# Patient Record
Sex: Female | Born: 1990 | Race: White | Hispanic: No | Smoking: Never smoker
Health system: Southern US, Community
[De-identification: ages and names within clinical notes are randomized; demographics above are authoritative.]

## PROBLEM LIST (undated history)

## (undated) DIAGNOSIS — L501 Idiopathic urticaria: Secondary | ICD-10-CM

## (undated) DIAGNOSIS — T7840XA Allergy, unspecified, initial encounter: Secondary | ICD-10-CM

## (undated) DIAGNOSIS — R87612 Low grade squamous intraepithelial lesion on cytologic smear of cervix (LGSIL): Secondary | ICD-10-CM

## (undated) HISTORY — DX: Low grade squamous intraepithelial lesion on cytologic smear of cervix (LGSIL): R87.612

## (undated) HISTORY — DX: Idiopathic urticaria: L50.1

## (undated) HISTORY — DX: Allergy, unspecified, initial encounter: T78.40XA

---
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
## 2008-03-28 ENCOUNTER — Other Ambulatory Visit: Admission: RE | Admit: 2008-03-28 | Discharge: 2008-03-28 | Payer: Self-pay | Admitting: Internal Medicine

## 2008-09-18 IMAGING — NM NM HEPATO W/GB/PHARM/[PERSON_NAME]
2 series · 12 of 12 positions shown · non-contrast
Comparison: none

CLINICAL DATA: Epigastric pain.  
 NUCLEAR MEDICINE HEPATOBILIARY SCAN WITH EJECTION FRACTION:
TECHNIQUE: Sequential abdominal images were obtained following intravenous injection of radiopharmaceutical.  Sequential images were continued following oral ingestion of 8 oz. half-and-half, and the gallbladder ejection fraction was calculated.
 Radiopharmaceutical:    5.4 mCi Mc-ZZm Choletec.   8 oz. of half-and-half p.o.

[hida · 2.40mm/px · 6 of 60 frames shown (1 of 2)]
[frame 6/60]
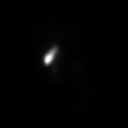
[frame 16/60]
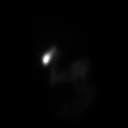
[frame 26/60]
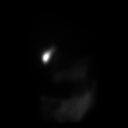
[frame 36/60]
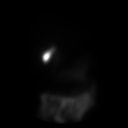
[frame 46/60]
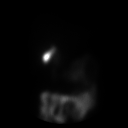
[frame 56/60]
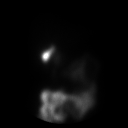

[hida · 2.40mm/px · 6 of 60 frames shown (2 of 2)]
[frame 6/60]
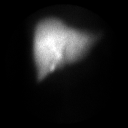
[frame 16/60]
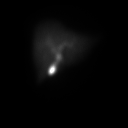
[frame 26/60]
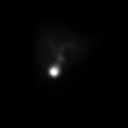
[frame 36/60]
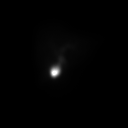
[frame 46/60]
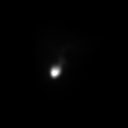
[frame 56/60]
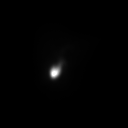

[12 of 12 positions shown; findings below may reference images not displayed]

FINDINGS: There is prompt and uniform uptake in the liver.  The gallbladder is visualized at 10 minutes with bowel activity confidently seen 10 minutes after oral ingestion of half-and-half.  Gallbladder ejection fraction is 82% (normal is greater than 50%).
IMPRESSION: No acute findings.  Normal gallbladder ejection fraction.

## 2009-04-05 ENCOUNTER — Other Ambulatory Visit: Admission: RE | Admit: 2009-04-05 | Discharge: 2009-04-05 | Payer: Self-pay | Admitting: Internal Medicine

## 2009-11-03 ENCOUNTER — Ambulatory Visit: Payer: Self-pay | Admitting: "Endocrinology

## 2010-09-16 ENCOUNTER — Encounter: Payer: Self-pay | Admitting: Internal Medicine

## 2011-01-08 NOTE — Assessment & Plan Note (Signed)
[facility]                         GASTROENTEROLOGY OFFICE NOTE   FONTAINE, KOSSMAN                     MRN:          [id number]  DATE:06/18/2007                            DOB:          [birth year]    The patient is self-referred.   REASON FOR CONSULTATION:  Recurrent abdominal pain.   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  She is  accompanied by her mother.  She has no significant past medical or past  surgical history.  Patient reports an 18 to 24 month history of  recurrent epigastric pain.  The pain is described as sharp.  When  particularly bad, there was radiation into the back.  The pain lasts  anywhere from 30 minutes to 10 hours.  She has had 10 or so episodes  over the past year.  Recently, she had her most severe episode which  lasted about 10 hours.  There is no associated nausea, vomiting, fever,  change in bowel habits, melena, hematochezia, jaundice, urinary  complaints or weight loss.  She has been given Levsin and Bentyl without  improvement.  When she is not experiencing pain she feels perfectly well  with no complaints.  She has not had evaluation.   PAST MEDICAL HISTORY:  None.   PAST SURGICAL HISTORY:  None.   ALLERGIES:  No known drug allergies.   CURRENT MEDICATIONS:  1. Yaz birth control once daily.  2. Multivitamin.  3. Allegra.   FAMILY HISTORY:  1. No family history of gastrointestinal malignancy.  2. Multiple relatives with gallbladder problems requiring      cholecystectomy.   SOCIAL HISTORY:  1. Patient is single without children.  2. She lives with her parents.  3. She is a Quarry manager at [employer].  4. She does not smoke or use alcohol.   REVIEW OF SYSTEMS:  Per Diagnostic Evaluation Form.  Last menstrual  period June 09, 2007.   PHYSICAL EXAM:  A well-appearing female in no acute  distress.  Blood  pressure is 100/62, heart rate 70, weight is 136 pounds, she is 5 feet 5-  1/2 inches in height.  HEENT:  Sclera are anicteric, conjunctiva are pink, oral mucosa is  intact, no adenopathy.  LUNGS:  Clear.  HEART:  Regular.  ABDOMEN:  Soft without tenderness, mass or hernia, good bowel sounds  heard.  EXTREMITIES:  Without edema.   IMPRESSION:  An 20 year old female with recurrent epigastric pain  generally lasting hours.  Well in between attacks of pain.  Symptoms are  most compatible with biliary colic.   RECOMMENDATIONS:  1. Abdominal ultrasound.  2. If no gallstones then proceed to HIDA scan.  3. Further recommendations after the results of the above known.     Wilhemina Bonito. Marina Goodell, MD  Electronically Signed    JNP/MedQ  DD: 06/18/2007  DT: 06/19/2007  Job #: [id number]   cc:   Lovenia Kim, D.O.

## 2011-01-08 NOTE — Assessment & Plan Note (Signed)
[facility]                         GASTROENTEROLOGY OFFICE NOTE   Sophia, Ball                     MRN:          [id number]  DATE:07/24/2007                            DOB:          1991-05-02    HISTORY:  Sophia Ball presents today for followup.  She is accompanied by  her mother.  She was evaluated June 18, 2007, for recurrent abdominal  pain.  See that dictation for details.  Her symptoms were most  compatible with biliary colic.  She underwent an abdominal ultrasound  which was entirely normal.  She subsequently underwent a nuclear  medicine hepatobiliary scan. This was also normal.  Since her last  office visit she has had one additional attack of epigastric pain  lasting approximately 1 hour.  Otherwise, been doing well.   PHYSICAL EXAMINATION TODAY:  Finds a well-appearing female in no acute  distress.  Blood pressure is 98/70, heart rate is 92, weight is 134.8  pounds.  HEENT:  Sclerae anicteric.  ABDOMEN:  Soft without tenderness, mass or hernia.  Good bowel sounds  heard.   IMPRESSION:  Recurrent epigastric pain of uncertain etiology.  Negative  ultrasound and HIDA scan.   RECOMMENDATIONS:  1. Baseline laboratories today including CBC, comprehensive metabolic      panel, pancreatic enzymes, urinalysis, and urine porphyrin screen.  2. Asked to have liver tests, amylase, lipase, and urinalysis      performed during an episode of acute pain.  3. If pain persists without any obvious cause, then consider upper      endoscopy and/or additional imaging.     Wilhemina Bonito. Marina Goodell, MD  Electronically Signed    JNP/MedQ  DD: 07/24/2007  DT: 07/24/2007  Job #: [id number]   cc:   Lovenia Kim, D.O.

## 2011-04-17 ENCOUNTER — Other Ambulatory Visit (HOSPITAL_COMMUNITY)
Admission: RE | Admit: 2011-04-17 | Discharge: 2011-04-17 | Disposition: A | Discharge status: Home / Self Care | Payer: BC Managed Care – PPO | Source: Ambulatory Visit | Attending: Internal Medicine | Admitting: Internal Medicine

## 2011-04-17 DIAGNOSIS — Z01419 Encounter for gynecological examination (general) (routine) without abnormal findings: Secondary | ICD-10-CM | POA: Insufficient documentation

## 2013-06-01 ENCOUNTER — Other Ambulatory Visit (HOSPITAL_COMMUNITY)
Admission: RE | Admit: 2013-06-01 | Discharge: 2013-06-01 | Disposition: A | Discharge status: Home / Self Care | Payer: BC Managed Care – PPO | Source: Ambulatory Visit | Attending: Internal Medicine | Admitting: Internal Medicine

## 2013-06-01 DIAGNOSIS — Z01419 Encounter for gynecological examination (general) (routine) without abnormal findings: Secondary | ICD-10-CM | POA: Insufficient documentation

## 2013-11-11 ENCOUNTER — Ambulatory Visit (INDEPENDENT_AMBULATORY_CARE_PROVIDER_SITE_OTHER): Payer: BC Managed Care – PPO | Admitting: Emergency Medicine

## 2013-11-11 ENCOUNTER — Encounter: Payer: Self-pay | Admitting: Emergency Medicine

## 2013-11-11 VITALS — BP 102/58 | HR 64 | Temp 98.2°F | Resp 16 | Wt 138.0 lb

## 2013-11-11 DIAGNOSIS — J020 Streptococcal pharyngitis: Secondary | ICD-10-CM

## 2013-11-11 MED ORDER — PREDNISONE 10 MG PO TABS
ORAL_TABLET | ORAL | Status: DC
Start: 2013-11-11 — End: 2014-05-10

## 2013-11-11 MED ORDER — DEXAMETHASONE SODIUM PHOSPHATE 100 MG/10ML IJ SOLN
10.0000 mg | Freq: Once | INTRAMUSCULAR | Status: AC
  Administered 2013-11-11: 10 mg via INTRAMUSCULAR

## 2013-11-11 MED ORDER — CEFTRIAXONE SODIUM 1 G IJ SOLR
1.0000 g | Freq: Once | INTRAMUSCULAR | Status: AC
Start: 2013-11-11 — End: 2013-11-11
  Administered 2013-11-11: 1 g via INTRAMUSCULAR

## 2013-11-11 MED ORDER — DOXYCYCLINE HYCLATE 100 MG PO TABS: 100.0000 mg | ORAL_TABLET | Freq: Two times a day (BID) | ORAL | Status: DC

## 2013-11-11 NOTE — Patient Instructions (Signed)
Peritonsillar Abscess Warm salt water gargles daily. 1 tsp liquid benadryl + 1 tsp liquid Maalox, MIX/ GARGLE/ SPIT as needed for pain ER if symptoms increase. Peritonsillar abscess is a collection of yellowish white fluid (pus) in the back of the throat. This fluid forms behind the tonsils. The treatment is most often drainage. This is done by:  Putting a needle into the abscess.  Cutting and draining the abscess. HOME CARE  If your abscess was drained today:  Mix 1 teaspoon of salt in 8 ounces of warm water for gargling.  Gargle the warm salt water.  Gargle 4 times per day or as needed for comfort. Do not swallow this mixture.  Rest in bed as needed.  Return to normal activity as soon as you can.  Apply cold to your neck for pain relief.  Put ice in a plastic bag.  Place a towel between your skin and the bag.  Leave the ice on for 15-20 minutes at a time, 03-04 times a day.  Eat a soft or liquid diet. Drink cold fluids. Cold fluids will soothe and take puffiness (swelling) down.  Only take medicine as told by your doctor.  Take all medicine as told. GET HELP RIGHT AWAY IF:   You are coughing up or throwing up (vomiting) blood.  Your throat pain is severe and pain medicine does not help it.  You have trouble talking or breathing.  You have trouble swallowing or eating.  You find it easier to breathe while leaning forward.  You have pain that gets worse.  You have pain, puffiness, redness, or drainage in your throat.  You have a fever.  You become dizzy, have a headache, have low energy, or feel sick.  You have signs of body fluid loss (dehydration). This includes lightheadedness when standing, peeing (urinating) less, a fast heart rate, or dry mouth and nose.  You start to drool. MAKE SURE YOU:  Understand these instructions.  Will watch your condition.  Will get help if you are not doing well or get worse. Document Released: 07/31/2009 Document  Revised: 11/04/2011 Document Reviewed: 07/31/2009 [facility] Patient Information 2014 [place], [place]. Strep Throat Strep throat is an infection of the throat. It is caused by a germ. Strep throat spreads from person to person by coughing, sneezing, or close contact. HOME CARE  Rinse your mouth (gargle) with warm salt water (1 teaspoon salt in 1 cup of water). Do this 3 to 4 times per day or as needed for comfort.  Family members with a sore throat or fever should see a doctor.  Make sure everyone in your house washes their hands well.  Do not share food, drinking cups, or personal items.  Eat soft foods until your sore throat gets better.  Drink enough water and fluids to keep your pee (urine) clear or pale yellow.  Rest.  Stay home from school, daycare, or work until you have taken medicine for 24 hours.  Only take medicine as told by your doctor.  Take your medicine as told. Finish it even if you start to feel better. GET HELP RIGHT AWAY IF:   You have new problems, such as throwing up (vomiting) or bad headaches.  You have a stiff or painful neck, chest pain, trouble breathing, or trouble swallowing.  You have very bad throat pain, drooling, or changes in your voice.  Your neck puffs up (swells) or gets red and tender.  You have a fever.  You are very tired, your  mouth is dry, or you are peeing less than normal.  You cannot wake up completely.  You get a rash, cough, or earache.  You have green, yellow-brown, or bloody spit.  Your pain does not get better with medicine. MAKE SURE YOU:   Understand these instructions.  Will watch your condition.  Will get help right away if you are not doing well or get worse. Document Released: 01/29/2008 Document Revised: 11/04/2011 Document Reviewed: 10/11/2010 [facility] Patient Information 2014 [place], [place].

## 2013-11-11 NOTE — Progress Notes (Signed)
   Subjective:    Patient ID: Sophia Ball, female    DOB: 1991/08/17, 23 y.o.   MRN: [id number]  HPI Comments: 23 yo female with sore throat for over 1 week. She started Zpak on Tuesday w/o any relief. She notes blisters on large tonsils, increased lymph nodes  And sore throat. She denies fatigue. She has been taking Advil with out relief with her fever and pain.  Sore Throat   Fever  Associated symptoms include a sore throat.     Medication List       This list is accurate as of: 11/11/13  4:46 PM.  Always use your most recent med list.               azithromycin 250 MG tablet  Commonly known as:  ZITHROMAX  Take by mouth daily.     doxycycline 100 MG tablet  Commonly known as:  VIBRA-TABS  Take 1 tablet (100 mg total) by mouth 2 (two) times daily.     drospirenone-ethinyl estradiol 3-0.02 MG tablet  Commonly known as:  YAZ,GIANVI,LORYNA  Take 1 tablet by mouth daily.     predniSONE 10 MG tablet  Commonly known as:  DELTASONE  1 po TID x 3 days, 1 PO BID x 3 days, 1 po QD x 5 days       No Known Allergies No past medical history on file.    Review of Systems  Constitutional: Positive for fever.  HENT: Positive for sore throat.   All other systems reviewed and are negative.   BP 102/58  Pulse 64  Temp(Src) 98.2 F (36.8 C) (Temporal)  Resp 16  Wt 138 lb (62.596 kg)  LMP 10/21/2013     Objective:   Physical Exam  Nursing note and vitals reviewed. Constitutional: She is oriented to person, place, and time. She appears well-developed and well-nourished.  HENT:  Head: Normocephalic and atraumatic.  Right Ear: External ear normal.  Left Ear: External ear normal.  Nose: Nose normal.  Mouth/Throat: Oropharyngeal exudate present.  2+ tonsils erythematous, large patches yellow/ white, with slight odor   Eyes: Conjunctivae and EOM are normal.  Neck: Normal range of motion.  Bilateral quarter size appropriximately   Cardiovascular: Normal rate,  regular rhythm, normal heart sounds and intact distal pulses.   Pulmonary/Chest: Effort normal and breath sounds normal.  Abdominal: Soft. There is no tenderness.  Musculoskeletal: Normal range of motion.  Lymphadenopathy:    She has cervical adenopathy.  Neurological: She is alert and oriented to person, place, and time.  Skin: Skin is warm and dry.  Psychiatric: She has a normal mood and affect. Judgment normal.          Assessment & Plan:  1. Probable strept- Rocephin 1gm, Dexamethasone 10 mg, Continue Zpak AD, Add Doxy 100 BID and Pred DP 10 MG AD tomorrow. Push fluids if any increase in symptoms ER. OOW until Monday.  Advised of risk of abscess patient aware and will go to ER if any sign.

## 2014-05-10 ENCOUNTER — Encounter: Payer: Self-pay | Admitting: Internal Medicine

## 2014-05-10 ENCOUNTER — Ambulatory Visit (INDEPENDENT_AMBULATORY_CARE_PROVIDER_SITE_OTHER): Payer: BC Managed Care – PPO | Admitting: Internal Medicine

## 2014-05-10 VITALS — BP 112/60 | HR 96 | Temp 100.4°F | Resp 16 | Ht 66.0 in | Wt 140.6 lb

## 2014-05-10 DIAGNOSIS — J029 Acute pharyngitis, unspecified: Secondary | ICD-10-CM

## 2014-05-10 DIAGNOSIS — J039 Acute tonsillitis, unspecified: Secondary | ICD-10-CM

## 2014-05-10 MED ORDER — PREDNISONE 20 MG PO TABS: ORAL_TABLET | ORAL | Status: DC

## 2014-05-10 MED ORDER — AZITHROMYCIN 250 MG PO TABS: ORAL_TABLET | ORAL | Status: DC

## 2014-05-10 MED ORDER — HYDROCODONE-ACETAMINOPHEN 5-325 MG PO TABS: ORAL_TABLET | ORAL | Status: AC

## 2014-05-10 NOTE — Progress Notes (Signed)
   Subjective:    Patient ID: Sophia Ball, female    DOB: 06/20/1991, 23 y.o.   MRN: [id number]  Sore Throat  This is a new problem. The current episode started yesterday. The problem has been gradually worsening. Neither side of throat is experiencing more pain than the other. The maximum temperature recorded prior to her arrival was 100 - 100.9 F. The fever has been present for less than 1 day. The pain is mild. Associated symptoms include neck pain and swollen glands. Pertinent negatives include no abdominal pain, congestion, coughing, diarrhea, drooling, ear discharge, ear pain, headaches, hoarse voice, plugged ear sensation, shortness of breath or trouble swallowing. She has tried nothing for the symptoms.   Review of Systems  Constitutional: Positive for fever. Negative for chills and diaphoresis.  HENT: Negative for congestion, dental problem, drooling, ear discharge, ear pain, facial swelling, hearing loss, hoarse voice, mouth sores, nosebleeds, postnasal drip, rhinorrhea, sinus pressure and trouble swallowing.   Eyes: Negative.   Respiratory: Negative.  Negative for cough and shortness of breath.   Gastrointestinal: Negative for abdominal pain and diarrhea.  Musculoskeletal: Positive for neck pain.  Neurological: Negative.  Negative for dizziness, speech difficulty, light-headedness and headaches.   Objective:   Physical Exam  Constitutional: She appears well-nourished.  In No Distress.  HENT:  Right Ear: External ear normal.  Left Ear: External ear normal.  Nose: Nose normal.  Posterior Pharynx 2+ injected with slight exudates and no ulcerations. Tonsils 2+ red and cryptic wit slight exudate.  Eyes: Conjunctivae and EOM are normal. Pupils are equal, round, and reactive to light. Right eye exhibits no discharge. Left eye exhibits no discharge.  Neck: Normal range of motion. Neck supple. No tracheal deviation present.  Cardiovascular: Normal rate, regular rhythm and normal  heart sounds.   No murmur heard. Pulmonary/Chest: Breath sounds normal. No respiratory distress. She has no wheezes. She has no rales. She exhibits no tenderness.  Lymphadenopathy:    She has cervical adenopathy.   Assessment & Plan:   1. Acute tonsillitis  2. Acute pharyngitis, unspecified pharyngitis type  Rx- Z Pak, Prednisone taper, Norco- discussed H2O2/mouthwash gargles & liquid diet  Discussed med effects/SE's. ROV - prn

## 2014-06-06 ENCOUNTER — Encounter: Payer: Self-pay | Admitting: Emergency Medicine

## 2014-06-08 ENCOUNTER — Encounter: Payer: Self-pay | Admitting: Emergency Medicine

## 2014-06-10 ENCOUNTER — Encounter: Payer: Self-pay | Admitting: Emergency Medicine

## 2014-06-23 DIAGNOSIS — T7840XA Allergy, unspecified, initial encounter: Secondary | ICD-10-CM | POA: Insufficient documentation

## 2014-06-30 ENCOUNTER — Encounter: Payer: Self-pay | Admitting: Emergency Medicine

## 2014-07-15 ENCOUNTER — Encounter: Payer: Self-pay | Admitting: Emergency Medicine

## 2014-07-18 ENCOUNTER — Encounter: Payer: Self-pay | Admitting: Emergency Medicine

## 2014-07-28 ENCOUNTER — Encounter: Payer: Self-pay | Admitting: Emergency Medicine

## 2014-08-01 ENCOUNTER — Other Ambulatory Visit: Payer: Self-pay | Admitting: *Deleted

## 2014-08-01 MED ORDER — DROSPIRENONE-ETHINYL ESTRADIOL 3-0.02 MG PO TABS: 1.0000 | ORAL_TABLET | Freq: Every day | ORAL | Status: DC

## 2014-08-03 ENCOUNTER — Other Ambulatory Visit: Payer: Self-pay | Admitting: Physician Assistant

## 2014-08-03 ENCOUNTER — Encounter: Payer: Self-pay | Admitting: Physician Assistant

## 2014-08-03 MED ORDER — DROSPIRENONE-ETHINYL ESTRADIOL 3-0.02 MG PO TABS: 1.0000 | ORAL_TABLET | Freq: Every day | ORAL | Status: DC

## 2014-08-31 ENCOUNTER — Other Ambulatory Visit (HOSPITAL_COMMUNITY)
Admission: RE | Admit: 2014-08-31 | Discharge: 2014-08-31 | Disposition: A | Discharge status: Home / Self Care | Payer: BC Managed Care – PPO | Source: Ambulatory Visit | Attending: Internal Medicine | Admitting: Internal Medicine

## 2014-08-31 ENCOUNTER — Encounter: Payer: Self-pay | Admitting: Emergency Medicine

## 2014-08-31 ENCOUNTER — Ambulatory Visit (INDEPENDENT_AMBULATORY_CARE_PROVIDER_SITE_OTHER): Payer: BLUE CROSS/BLUE SHIELD | Admitting: Emergency Medicine

## 2014-08-31 VITALS — BP 98/62 | HR 58 | Temp 98.4°F | Resp 16 | Ht 66.0 in | Wt 140.0 lb

## 2014-08-31 DIAGNOSIS — Z79899 Other long term (current) drug therapy: Secondary | ICD-10-CM

## 2014-08-31 DIAGNOSIS — E559 Vitamin D deficiency, unspecified: Secondary | ICD-10-CM

## 2014-08-31 DIAGNOSIS — Z124 Encounter for screening for malignant neoplasm of cervix: Secondary | ICD-10-CM

## 2014-08-31 DIAGNOSIS — Z01411 Encounter for gynecological examination (general) (routine) with abnormal findings: Secondary | ICD-10-CM | POA: Insufficient documentation

## 2014-08-31 DIAGNOSIS — Z0000 Encounter for general adult medical examination without abnormal findings: Secondary | ICD-10-CM

## 2014-08-31 MED ORDER — DROSPIRENONE-ETHINYL ESTRADIOL 3-0.02 MG PO TABS: 1.0000 | ORAL_TABLET | Freq: Every day | ORAL | Status: AC

## 2014-09-01 LAB — CBC WITH DIFFERENTIAL/PLATELET
Basophils Absolute: 0 10*3/uL (ref 0.0–0.1)
Basophils Relative: 0 % (ref 0–1)
Eosinophils Absolute: 0.2 10*3/uL (ref 0.0–0.7)
Eosinophils Relative: 2 % (ref 0–5)
HEMATOCRIT: 42.1 % (ref 36.0–46.0)
Hemoglobin: 13.9 g/dL (ref 12.0–15.0)
LYMPHS PCT: 20 % (ref 12–46)
Lymphs Abs: 1.8 10*3/uL (ref 0.7–4.0)
MCH: 28 pg (ref 26.0–34.0)
MCHC: 33 g/dL (ref 30.0–36.0)
MCV: 84.9 fL (ref 78.0–100.0)
MPV: 9.3 fL (ref 8.6–12.4)
Monocytes Absolute: 0.5 10*3/uL (ref 0.1–1.0)
Monocytes Relative: 6 % (ref 3–12)
NEUTROS PCT: 72 % (ref 43–77)
Neutro Abs: 6.6 10*3/uL (ref 1.7–7.7)
PLATELETS: 330 10*3/uL (ref 150–400)
RBC: 4.96 MIL/uL (ref 3.87–5.11)
RDW: 14.2 % (ref 11.5–15.5)
WBC: 9.1 10*3/uL (ref 4.0–10.5)

## 2014-09-01 LAB — TSH: TSH: 0.708 u[IU]/mL (ref 0.350–4.500)

## 2014-09-01 LAB — LIPID PANEL
Cholesterol: 200 mg/dL (ref 0–200)
HDL: 110 mg/dL (ref >39)
LDL Cholesterol: 76 mg/dL (ref 0–99)
Total CHOL/HDL Ratio: 1.8 Ratio
Triglycerides: 68 mg/dL (ref <150)
VLDL: 14 mg/dL (ref 0–40)

## 2014-09-01 LAB — HEMOGLOBIN A1C
Hgb A1c MFr Bld: 5.2 % (ref <5.7)
MEAN PLASMA GLUCOSE: 103 mg/dL (ref <117)

## 2014-09-01 LAB — BASIC METABOLIC PANEL WITH GFR
BUN: 8 mg/dL (ref 6–23)
CALCIUM: 10 mg/dL (ref 8.4–10.5)
CHLORIDE: 102 meq/L (ref 96–112)
CO2: 26 meq/L (ref 19–32)
CREATININE: 0.7 mg/dL (ref 0.50–1.10)
GFR, Est African American: >89 mL/min
GFR, Est Non African American: >89 mL/min
GLUCOSE: 87 mg/dL (ref 70–99)
Potassium: 4.1 mEq/L (ref 3.5–5.3)
Sodium: 138 mEq/L (ref 135–145)

## 2014-09-01 LAB — HEPATIC FUNCTION PANEL
ALK PHOS: 49 U/L (ref 39–117)
ALT: 9 U/L (ref 0–35)
AST: 16 U/L (ref 0–37)
Albumin: 4.6 g/dL (ref 3.5–5.2)
BILIRUBIN DIRECT: 0.1 mg/dL (ref 0.0–0.3)
BILIRUBIN TOTAL: 0.6 mg/dL (ref 0.2–1.2)
Indirect Bilirubin: 0.5 mg/dL (ref 0.2–1.2)
Total Protein: 7.9 g/dL (ref 6.0–8.3)

## 2014-09-01 LAB — URINALYSIS, ROUTINE W REFLEX MICROSCOPIC
Bilirubin Urine: NEGATIVE
Glucose, UA: NEGATIVE mg/dL
Hgb urine dipstick: NEGATIVE
Ketones, ur: NEGATIVE mg/dL
Leukocytes, UA: NEGATIVE
NITRITE: NEGATIVE
PH: 6.5 (ref 5.0–8.0)
Protein, ur: NEGATIVE mg/dL
SPECIFIC GRAVITY, URINE: 1.011 (ref 1.005–1.030)
UROBILINOGEN UA: 0.2 mg/dL (ref 0.0–1.0)

## 2014-09-01 LAB — INSULIN, FASTING: Insulin fasting, serum: 4 u[IU]/mL (ref 2.0–19.6)

## 2014-09-01 LAB — VITAMIN D 25 HYDROXY (VIT D DEFICIENCY, FRACTURES): VIT D 25 HYDROXY: 25 ng/mL — AB (ref 30–100)

## 2014-09-01 LAB — MAGNESIUM: MAGNESIUM: 2 mg/dL (ref 1.5–2.5)

## 2014-09-02 LAB — CYTOLOGY - PAP

## 2014-09-03 ENCOUNTER — Encounter: Payer: Self-pay | Admitting: Emergency Medicine

## 2014-09-03 NOTE — Progress Notes (Addendum)
Subjective:    Patient ID: Sophia Ball, female    DOB: 01-12-1991, 24 y.o.   MRN: [id number]  HPI Comments: 24 yo WF CPE without concerns or complaints. She is doing well overall. She is exercising routinely and eating healthy.      Medication List       This list is accurate as of: 08/31/14 11:59 PM.  Always use your most recent med list.               drospirenone-ethinyl estradiol 3-0.02 MG tablet  Commonly known as:  YAZ,GIANVI,LORYNA  Take 1 tablet by mouth daily.     levocetirizine 5 MG tablet  Commonly known as:  XYZAL  Take 5 mg by mouth every evening.       No Known Allergies   Past Medical History  Diagnosis Date  . Allergy    History reviewed. No pertinent past surgical history.  History  Substance Use Topics  . Smoking status: Never Smoker   . Smokeless tobacco: Not on file  . Alcohol Use: Not on file   Family History  Problem Relation Age of Onset  . Cancer Maternal Aunt 60    great aunt-breast  . Heart disease Maternal Grandmother   . Hyperlipidemia Maternal Grandmother   . Heart disease Maternal Grandfather   . Hyperlipidemia Maternal Grandfather   . Heart disease Paternal Grandmother   . Hyperlipidemia Paternal Grandmother   . Heart disease Paternal Grandfather   . Hyperlipidemia Paternal Grandfather      MAINTENANCE: Pap/ Pelvic:2014 wnl LMP: 2 weeks ago EYE:n/a Dentist:Q 6 month  IMMUNIZATIONS: Tdap:2014 Influenza: 2015 at work  Patient Care Team: Lucky CowboyWilliam McKeown, MD as PCP - General (Internal Medicine) Eileen StanfordMeg Whelan, MD as Referring Physician (Allergy and Immunology) Hilarie FredricksonJohn N Perry, MD as Consulting Physician (Gastroenterology) Amber Register, PA-C as Physician Assistant (Dermatology)   Review of Systems  Respiratory: Negative for shortness of breath.   Cardiovascular: Negative for chest pain.  All other systems reviewed and are negative.  BP 98/62 mmHg  Pulse 58  Temp(Src) 98.4 F (36.9 C) (Temporal)  Resp 16  Ht 5'  6" (1.676 m)  Wt 140 lb (63.504 kg)  BMI 22.61 kg/m2  LMP 08/17/2014     Objective:   Physical Exam  Constitutional: She is oriented to person, place, and time. She appears well-developed and well-nourished. No distress.  HENT:  Head: Normocephalic and atraumatic.  Right Ear: External ear normal.  Left Ear: External ear normal.  Nose: Nose normal.  Mouth/Throat: Oropharynx is clear and moist.  Eyes: Conjunctivae and EOM are normal. Pupils are equal, round, and reactive to light. Right eye exhibits no discharge. Left eye exhibits no discharge. No scleral icterus.  Neck: Normal range of motion. Neck supple. No JVD present. No tracheal deviation present. No thyromegaly present.  Cardiovascular: Normal rate, regular rhythm, normal heart sounds and intact distal pulses.   Pulmonary/Chest: Effort normal and breath sounds normal.  Abdominal: Soft. Bowel sounds are normal. She exhibits no distension and no mass. There is no tenderness. There is no rebound and no guarding.  Genitourinary: Vagina normal and uterus normal.  BreastsWNL  Musculoskeletal: Normal range of motion. She exhibits no edema or tenderness.  Lymphadenopathy:    She has no cervical adenopathy.  Neurological: She is alert and oriented to person, place, and time. She has normal reflexes. No cranial nerve deficit. She exhibits normal muscle tone. Coordination normal.  Skin: Skin is warm and dry. No rash noted. No  erythema. No pallor.  Psychiatric: She has a normal mood and affect. Her behavior is normal. Judgment and thought content normal.  Nursing note and vitals reviewed.  EKG NSCSPT  Repeat at age 54 unless symptomatic    Assessment & Plan:  1. CPE- Update screening labs/ History/ Immunizations/ Testing as needed. Advised healthy diet, QD exercise, increase H20 and continue RX/ Vitamins AD.  2. Birth Control- Refill x 1 year   Discussed with patient difficulty with scheduling for CPE with provider scheduling changed.  Advised apology for Email she received from our office in regards to missed appointment, especially since no previous history of NON-compliance.

## 2014-09-05 ENCOUNTER — Other Ambulatory Visit: Payer: Self-pay | Admitting: Internal Medicine

## 2014-09-05 DIAGNOSIS — B373 Candidiasis of vulva and vagina: Secondary | ICD-10-CM

## 2014-09-05 DIAGNOSIS — B3731 Acute candidiasis of vulva and vagina: Secondary | ICD-10-CM

## 2014-09-05 MED ORDER — FLUCONAZOLE 150 MG PO TABS: ORAL_TABLET | ORAL | Status: AC

## 2014-09-05 MED ORDER — FLUCONAZOLE 150 MG PO TABS: ORAL_TABLET | ORAL | Status: DC

## 2015-07-26 ENCOUNTER — Encounter: Payer: Self-pay | Admitting: Physician Assistant

## 2015-07-31 ENCOUNTER — Encounter: Payer: Self-pay | Admitting: Physician Assistant

## 2015-08-27 DIAGNOSIS — R87612 Low grade squamous intraepithelial lesion on cytologic smear of cervix (LGSIL): Secondary | ICD-10-CM

## 2015-08-27 HISTORY — DX: Low grade squamous intraepithelial lesion on cytologic smear of cervix (LGSIL): R87.612

## 2015-09-05 ENCOUNTER — Encounter: Payer: Self-pay | Admitting: Internal Medicine

## 2016-11-11 DIAGNOSIS — Z6822 Body mass index (BMI) 22.0-22.9, adult: Secondary | ICD-10-CM | POA: Diagnosis not present

## 2016-11-11 DIAGNOSIS — Z01419 Encounter for gynecological examination (general) (routine) without abnormal findings: Secondary | ICD-10-CM | POA: Diagnosis not present

## 2017-04-21 DIAGNOSIS — M79672 Pain in left foot: Secondary | ICD-10-CM | POA: Diagnosis not present

## 2017-04-21 DIAGNOSIS — M722 Plantar fascial fibromatosis: Secondary | ICD-10-CM | POA: Diagnosis not present

## 2017-09-25 DIAGNOSIS — J101 Influenza due to other identified influenza virus with other respiratory manifestations: Secondary | ICD-10-CM | POA: Diagnosis not present

## 2017-11-03 DIAGNOSIS — Z6822 Body mass index (BMI) 22.0-22.9, adult: Secondary | ICD-10-CM | POA: Diagnosis not present

## 2017-11-03 DIAGNOSIS — Z01419 Encounter for gynecological examination (general) (routine) without abnormal findings: Secondary | ICD-10-CM | POA: Diagnosis not present

## 2017-11-03 DIAGNOSIS — R87612 Low grade squamous intraepithelial lesion on cytologic smear of cervix (LGSIL): Secondary | ICD-10-CM | POA: Diagnosis not present

## 2017-11-03 LAB — HM PAP SMEAR: HM Pap smear: NORMAL

## 2018-01-27 ENCOUNTER — Ambulatory Visit: Payer: BLUE CROSS/BLUE SHIELD | Admitting: Family Medicine

## 2018-01-27 ENCOUNTER — Encounter: Payer: Self-pay | Admitting: Family Medicine

## 2018-01-27 VITALS — BP 112/70 | HR 58 | Temp 98.6°F | Resp 16 | Ht 66.5 in | Wt 146.0 lb

## 2018-01-27 DIAGNOSIS — J029 Acute pharyngitis, unspecified: Secondary | ICD-10-CM | POA: Diagnosis not present

## 2018-01-27 LAB — POCT RAPID STREP A (OFFICE): Rapid Strep A Screen: NEGATIVE

## 2018-01-27 NOTE — Progress Notes (Signed)
Chief Complaint  Patient presents with  . 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  Has been going to Sophia Ball, OB/GYN but has not had a PCP.    Sophia Ball is a 27 y.o. female who presents for evaluation of sore throat.  She has had a recent close exposure to someone with proven streptococcal pharyngitis at work last week.  Associated symptoms include body aches.  Denies fever, chills, headache, rhinorrhea, nasal congestion, ear pain, cough, chest pain, abdominal pain, N/V/D.   Reports having strep several times in the past. Last time was in February. No recent antibiotic.    Treatment to date: ibuprofen yesterday, nothing today.  No other aggravating or relieving factors.  No other c/o.  The following portions of the patient's history were reviewed and updated as appropriate: allergies, current medications, past medical history, past social history, past surgical history and problem list.  Grad[employer]. Currently working for [employer].   ROS as in subjective   Objective: Vitals:   01/27/18 1621  BP: 112/70  Pulse: (!) 58  Resp: 16  Temp: 98.6 F (37 C)  SpO2: 98%    General appearance: no distress, WD/WN, mildly ill-appearing HEENT: normocephalic, conjunctiva/corneas normal, sclerae anicteric, nares patent, no discharge or erythema, pharynx with erythema, without exudate.  Oral cavity: MMM, no lesions  Neck: supple, no lymphadenopathy, no thyromegaly Heart: RRR, normal S1, S2, no murmurs Lungs: CTA bilaterally, no wheezes, rhonchi, or rales   Laboratory Strep test done. Results:negative.    Assessment and Plan:  Advised that symptoms and exam suggest a viral etiology.  Discussed symptomatic treatment including salt water gargles, warm fluids, rest, hydrate well, can use  over-the-counter Tylenol or Ibuprofen for throat pain, fever, or malaise. If worse or not improving within 2-3 days, call or return.

## 2018-04-06 ENCOUNTER — Encounter: Payer: Self-pay | Admitting: Family Medicine

## 2018-04-06 ENCOUNTER — Ambulatory Visit: Payer: BLUE CROSS/BLUE SHIELD | Admitting: Family Medicine

## 2018-04-06 VITALS — BP 108/64 | HR 68 | Temp 99.2°F | Ht 65.0 in | Wt 143.4 lb

## 2018-04-06 DIAGNOSIS — R300 Dysuria: Secondary | ICD-10-CM | POA: Diagnosis not present

## 2018-04-06 DIAGNOSIS — N3000 Acute cystitis without hematuria: Secondary | ICD-10-CM

## 2018-04-06 LAB — POCT URINALYSIS DIP (PROADVANTAGE DEVICE)
GLUCOSE UA: NEGATIVE mg/dL
Ketones, POC UA: NEGATIVE mg/dL
NITRITE UA: POSITIVE — AB
PH UA: 6.5 (ref 5.0–8.0)
Protein Ur, POC: NEGATIVE mg/dL
Specific Gravity, Urine: 1.01

## 2018-04-06 MED ORDER — SULFAMETHOXAZOLE-TRIMETHOPRIM 800-160 MG PO TABS: 1.0000 | ORAL_TABLET | Freq: Two times a day (BID) | ORAL | 0 refills | Status: DC

## 2018-04-06 NOTE — Progress Notes (Signed)
Chief Complaint  Patient presents with  . Urinary Frequency    had UTI two weeks, was treated through E-visit and given macrobid BID x 7days. Symptoms started again last night of burning and frequency. Took last macrobid 8/5 in the evening.    While in [place] she developed UTI. Symptoms were mild at first, quickly got worse, saw blood in the urine. She did e-visit, prescribed macrobid x 7 days. Symptoms improved significantly, but last week wasn't sure if she had some slight residual symptoms, slightly sensitive.  Finished macrobid last week Last night she developed dysuria, blood in the urine, urgency/frequency. Took Azo last night at midnight (couldn't sleep), took another dose this morning. Recalls that prior to last UTI she did not void after intercourse.   PMH, PSH, SH reviewed (new patient to me) In a sexual relationship, boyfriend lives out of town.  Current Outpatient Medications on File Prior to Visit  Medication Sig Dispense Refill  . drospirenone-ethinyl estradiol (YAZ,GIANVI,LORYNA) 3-0.02 MG tablet Take 1 tablet by mouth daily. 3 Package 4   No current facility-administered medications on file prior to visit.     She denies fever, chills, nausea, vomiting, bowel changes, flank pain, URI symptoms, bleeding (except noted in urine), bruising, rash or other complaints.    PHYSICAL EXAM:  BP 108/64   Pulse 68   Temp 99.2 F (37.3 C) (Tympanic)   Ht 5\' 5"  (1.651 m)   Wt 143 lb 6.4 oz (65 kg)   LMP 04/01/2018 (Approximate)   BMI 23.86 kg/m   Well appearing, pleasant female, in no distress HEENT: conjunctiva and sclera are clear EOMI Neck: no lymphadenopathy or mass Heart: regular rate and rhythm Lungs: clear bilaterally Back: no CVA tenderness Abdomen: very mild suprapubic tenderness. No mass, no rebound, guarding, organomegaly or mass Extremities: no edema Skin: normal turgor, no rash Psych: normal mood, affect, hygiene and grooming  Urine dip: On AZO, orange in  color, not accurate reading   ASSESSMENT/PLAN:  Acute recurrent cystitis - send culture; treat with Bactrim (recently had macrobid). Risks/SE reviewed, as well as hygiene, voiding after intercourse - Plan: Urine Culture, sulfamethoxazole-trimethoprim (BACTRIM DS,SEPTRA DS) 800-160 MG tablet  Burning with urination - Plan: POCT Urinalysis DIP (Proadvantage Device), Urine Culture    Sign ROR Dr. Arlester Markerousins--she is scheduled for CPE in Jan; reports she had bloodwork through Dr. Cherly Hensenousins.

## 2018-04-06 NOTE — Patient Instructions (Signed)

## 2018-04-07 LAB — URINE CULTURE

## 2018-04-08 ENCOUNTER — Encounter: Payer: Self-pay | Admitting: Family Medicine

## 2018-04-20 ENCOUNTER — Encounter: Payer: Self-pay | Admitting: *Deleted

## 2018-05-24 DIAGNOSIS — L509 Urticaria, unspecified: Secondary | ICD-10-CM | POA: Diagnosis not present

## 2018-05-25 ENCOUNTER — Encounter: Payer: Self-pay | Admitting: Family Medicine

## 2018-05-26 ENCOUNTER — Telehealth: Payer: Self-pay | Admitting: Family Medicine

## 2018-05-26 ENCOUNTER — Ambulatory Visit: Payer: BLUE CROSS/BLUE SHIELD | Admitting: Family Medicine

## 2018-05-26 ENCOUNTER — Encounter: Payer: Self-pay | Admitting: Family Medicine

## 2018-05-26 VITALS — BP 100/68 | HR 72 | Temp 99.1°F | Ht 65.0 in | Wt 144.8 lb

## 2018-05-26 DIAGNOSIS — L509 Urticaria, unspecified: Secondary | ICD-10-CM | POA: Diagnosis not present

## 2018-05-26 DIAGNOSIS — R7989 Other specified abnormal findings of blood chemistry: Secondary | ICD-10-CM | POA: Diagnosis not present

## 2018-05-26 MED ORDER — METHYLPREDNISOLONE ACETATE 80 MG/ML IJ SUSP
80.0000 mg | Freq: Once | INTRAMUSCULAR | Status: AC
  Administered 2018-05-26: 80 mg via INTRAMUSCULAR

## 2018-05-26 NOTE — Patient Instructions (Addendum)
  Increase ranitidine (zantac) to 150mg  TWICE daily. Continue the zyrtec once daily. Use benadryl as needed for hives. Take 50mg  at bedtime, since your morning is the worst. We are giving you another steroid shot today. Tomorrow take 40mg  for 3 days, 30 for 3 days, 20 for 3 days, 10 for 5 days, knowing that we will need to change and adjust this plan based on if/when your hives recur. Contact us later in the week--we should have results by the end of the week to help also determine the plan (which may include referral to allergist, vs rheum if autoimmune test are abnormal.

## 2018-05-26 NOTE — Telephone Encounter (Signed)
Received requested records from [facility]. Sending back for review.  °

## 2018-05-26 NOTE — Progress Notes (Signed)
Chief Complaint  Patient presents with  . Rash    started Sept 11th and has gotten progressively worse. This Sunday was the worst. Went to UC. Had steroid shot, oral prednisone, zantac and zyrtec.    9/11 woke up with hives (back of her legs, wrists).  Took benadryl, didn't have them the next day, thought related to hotel she stayed at (was traveling for work at the time). Recurred the following day.  Moved up into her face just this past Sunday. Hives typically fade within a few hours of waking up, doesn't bother her during the day, wakes up with them Took 1 benadryl at 4 am (67m), didn't help.  2011 had similar problem, lasted for 6 months. CH50 elevated, other tests negative (thyroid, other complement levels--showed me lab slips that her mom emailed her on her phone). Negative allergy tests (other than dog/cat/dust). Ultimately it resolved, and hadn't had a problem again until 9/11.  No known triggers.  She had stayed in same hotels previously.  [place] +stress (nothing particularly new, just related to job/travel), no different than in the last few years.  Previously had more lip/tongue swelling, developed after finishing a prednisone taper.  Today she had slight lip swelling. No tongue/throat/SOB.  Neck is somewhat sensitive and painful today, feels like she may have slept wrong. Hadn't had neck pain/tightness prior to this morning.  No known fevers, URI symptoms, headaches, dizziness, other rashes.  No ABX since July (when in [place]. No dental pain, sore throat, GI or GU complaints. No joint pains.  PHYSICAL EXAM: BP 100/68   Pulse 72   Temp 99.1 F (37.3 C) (Tympanic)   Ht _0  (1.651 m)   Wt 144 lb 12.8 oz (65.7 kg)   LMP 05/01/2018 (Exact Date)   BMI 24.10 kg/m   Pleasant female, in no acute distress. Skin: Urticarial lesions on both cheeks, diffuse on arms.  Largest and most severe on both buttocks, posterior  thighs, mild across abdomen. HEENT: conjunctiva and sclera are clear, PERRL. TM's and EAC's normal. OP is clear, no lesions, swelling, moist mucus membranes.  Neck: no spinal tenderness, muscle spasm.  FROM.  Mild discomfort over right trapezius, no spasm, somewhat tender bilaterally, mild, at paraspinous muscles.  No lymphadenopathy (slight shotty node, nontender on right, pt states is not new), thyromegaly or mass Heart: regular rate and rhythm Lungs: clear bilaterally, no wheezes, rales, ronchi  ASSESSMENT/PLAN:  Urticaria of unknown origin - recurrent since 9/11, worsening; h/o chronic urticaria in 2011 x6 mos. LG temp--?viral, but persistent--check labs. May need allergist (vs rheum if abn lab) - Plan: Comprehensive metabolic panel, CBC with Differential/Platelet, Sedimentation Rate, ANA, IFA (with reflex), Thyroid Panel With TSH, methylPREDNISolone acetate (DEPO-MEDROL) injection 80 mg   Increase ranitidine (zantac) to 1565mTWICE daily. Continue the zyrtec once daily. Use benadryl as needed for hives, and take qHS (since worse in mornings)  Depo medrol 8086miven. Start back with oral prednisone tomorrow (took 78m74mready today), 40/40/40/30/30/30/20/20/20/ and 10 x 5 days, knowing that she will need refill and may need dose adjustments depending on if/when hives resolve or recur. Has enough to last a few days, and contact [place] the [place] with her response.   CBC, TSH, ANA, c-met, esr Consider allergy testing (blood, since on prednisone), hold off for now; prior allergy tests (skin), didn't reveal anything, and no history to suggest a potential cause to search for.   C4, complement  and allergy testing in future, depending on results

## 2018-05-27 ENCOUNTER — Encounter: Payer: Self-pay | Admitting: Family Medicine

## 2018-05-27 DIAGNOSIS — L509 Urticaria, unspecified: Secondary | ICD-10-CM

## 2018-05-27 LAB — CBC WITH DIFFERENTIAL/PLATELET
Basophils Absolute: 0 10*3/uL (ref 0.0–0.2)
Basos: 0 %
EOS (ABSOLUTE): 0 10*3/uL (ref 0.0–0.4)
Eos: 0 %
Hematocrit: 39 % (ref 34.0–46.6)
Hemoglobin: 13.1 g/dL (ref 11.1–15.9)
Immature Grans (Abs): 0 10*3/uL (ref 0.0–0.1)
Immature Granulocytes: 0 %
LYMPHS: 13 %
Lymphocytes Absolute: 1.7 10*3/uL (ref 0.7–3.1)
MCH: 29.4 pg (ref 26.6–33.0)
MCHC: 33.6 g/dL (ref 31.5–35.7)
MCV: 88 fL (ref 79–97)
MONOCYTES: 7 %
Monocytes Absolute: 1 10*3/uL — ABNORMAL HIGH (ref 0.1–0.9)
Neutrophils Absolute: 10.4 10*3/uL — ABNORMAL HIGH (ref 1.4–7.0)
Neutrophils: 80 %
Platelets: 323 10*3/uL (ref 150–450)
RBC: 4.45 x10E6/uL (ref 3.77–5.28)
RDW: 13 % (ref 12.3–15.4)
WBC: 13.1 10*3/uL — AB (ref 3.4–10.8)

## 2018-05-27 LAB — THYROID PANEL WITH TSH
FREE THYROXINE INDEX: 3 (ref 1.2–4.9)
T3 UPTAKE RATIO: 22 % — AB (ref 24–39)
T4, Total: 13.5 ug/dL — ABNORMAL HIGH (ref 4.5–12.0)
TSH: 2.16 u[IU]/mL (ref 0.450–4.500)

## 2018-05-27 LAB — COMPREHENSIVE METABOLIC PANEL
ALK PHOS: 52 IU/L (ref 39–117)
ALT: 11 IU/L (ref 0–32)
AST: 14 IU/L (ref 0–40)
Albumin/Globulin Ratio: 1.5 (ref 1.2–2.2)
Albumin: 4.1 g/dL (ref 3.5–5.5)
BUN/Creatinine Ratio: 13 (ref 9–23)
BUN: 12 mg/dL (ref 6–20)
Bilirubin Total: <0.2 mg/dL (ref 0.0–1.2)
CO2: 21 mmol/L (ref 20–29)
Calcium: 9 mg/dL (ref 8.7–10.2)
Chloride: 105 mmol/L (ref 96–106)
Creatinine, Ser: 0.91 mg/dL (ref 0.57–1.00)
GFR calc Af Amer: 99 mL/min/{1.73_m2} (ref >59)
GFR calc non Af Amer: 86 mL/min/{1.73_m2} (ref >59)
GLUCOSE: 95 mg/dL (ref 65–99)
Globulin, Total: 2.8 g/dL (ref 1.5–4.5)
Potassium: 4.3 mmol/L (ref 3.5–5.2)
Sodium: 142 mmol/L (ref 134–144)
Total Protein: 6.9 g/dL (ref 6.0–8.5)

## 2018-05-27 LAB — ANTINUCLEAR ANTIBODIES, IFA: ANA Titer 1: NEGATIVE

## 2018-05-27 LAB — SEDIMENTATION RATE: Sed Rate: 7 mm/hr (ref 0–32)

## 2018-05-28 ENCOUNTER — Telehealth: Payer: Self-pay

## 2018-05-28 LAB — SPECIMEN STATUS REPORT

## 2018-05-28 LAB — T4, FREE: Free T4: 1.41 ng/dL (ref 0.82–1.77)

## 2018-05-28 MED ORDER — PREDNISONE 10 MG PO TABS: ORAL_TABLET | ORAL | 0 refills | Status: DC

## 2018-05-28 NOTE — Telephone Encounter (Signed)
Pharmacy has sent fax requesting the directions for Prednisone patient was prescribed. Please advise.

## 2018-05-28 NOTE — Telephone Encounter (Signed)
Called pharmacy and gave the pharmacist directions.

## 2018-05-28 NOTE — Telephone Encounter (Signed)
Patient already has the instructions; course may vary depending on if she has recurrent hives as the dose is lowered.  Feel free to give the course as outlined in last note if really needed (she still had some left from rx from UC, but I don't know how many)

## 2018-06-01 ENCOUNTER — Encounter: Payer: Self-pay | Admitting: *Deleted

## 2018-06-03 ENCOUNTER — Encounter: Payer: Self-pay | Admitting: Family Medicine

## 2018-06-08 ENCOUNTER — Ambulatory Visit: Payer: BLUE CROSS/BLUE SHIELD | Admitting: Family Medicine

## 2018-06-11 ENCOUNTER — Encounter: Payer: Self-pay | Admitting: Family Medicine

## 2018-06-11 ENCOUNTER — Ambulatory Visit: Payer: BLUE CROSS/BLUE SHIELD | Admitting: Family Medicine

## 2018-06-11 VITALS — BP 110/70 | HR 72 | Ht 65.0 in | Wt 147.8 lb

## 2018-06-11 DIAGNOSIS — L508 Other urticaria: Secondary | ICD-10-CM | POA: Diagnosis not present

## 2018-06-11 DIAGNOSIS — L509 Urticaria, unspecified: Secondary | ICD-10-CM

## 2018-06-11 MED ORDER — PREDNISONE 10 MG PO TABS: ORAL_TABLET | ORAL | 0 refills | Status: DC

## 2018-06-11 NOTE — Progress Notes (Signed)
Chief Complaint  Patient presents with  . Follow-up    on hives.    Patient presents for follow-up on urticaria.  See MyChart messages.  She was put on prednisone taper, and when she got down to 10mg , hives flared again, went back up to 40mg , then 30mg , then when took 20mg , had flare again, so took add'l 20mg  that day, followed by 30 yesterday, 20mg  today.  Some indigestion since this past weekend, which surprised her, since she is taking zantac BID.  No seasonal allergy symptoms.  PMH, PSH, SH reviewed  Outpatient Encounter Medications as of 06/11/2018  Medication Sig Note  . cetirizine (ZYRTEC) 10 MG tablet Take 10 mg by mouth 2 (two) times daily.   . drospirenone-ethinyl estradiol (YAZ,GIANVI,LORYNA) 3-0.02 MG tablet Take 1 tablet by mouth daily.   . predniSONE (DELTASONE) 10 MG tablet Take taper as directed by physician   . ranitidine (ZANTAC) 150 MG capsule Take 150 mg by mouth 2 (two) times daily.   . [DISCONTINUED] predniSONE (DELTASONE) 10 MG tablet Take taper as directed by physician 06/11/2018: Last dose was today-20mg  this am.  . diphenhydrAMINE (BENADRYL) 25 mg capsule Take 25 mg by mouth every 6 (six) hours as needed. 06/11/2018: Uses prn breakthrough hives during the night (rare)   No facility-administered encounter medications on file as of 06/11/2018.    No Known Allergies  ROS: She denies fever, chills, URI symptoms, allergy symptoms, cough, shortness of breath.  Some recent indigestion, no other GI complaints.  Hives per HPI, no other skin concerns.   PHYSICAL EXAM:  BP 110/70   Pulse 72   Ht 5\' 5"  (1.651 m)   Wt 147 lb 12.8 oz (67 kg)   LMP 06/02/2018 (Exact Date)   BMI 24.60 kg/m   Well appearing, pleasant female, in no distress HEENT: conjunctiva and sclera are clear, EOMI, OP is clear Neck: no lymphadenopathy, thyromegaly or mass Heart: regular rate and rhythm, no murmur Lungs: clear bilaterally Skin: clear. No rashes or abnormality Psych: normal  mood, affect, hygiene and grooming Neuro: alert and oriented, cranial nerves intact, normal gait   ASSESSMENT/PLAN:  Chronic urticaria - Plan: predniSONE (DELTASONE) 10 MG tablet, Ambulatory referral to Allergy  Urticaria of unknown origin - Plan: predniSONE (DELTASONE) 10 MG tablet  Continue prednisone taper. Continue zyrtec BID, zantac BID, prednisone prn. Refer to allergist for  Input/treatment guidance.  Hoping that she can taper off steroids, and continue with antihistamines.  Taper-- 3 pills/day for the next 5 days, then 2 pills for a week, then 1 pill for a week. You will hopefully be seeing the allergist somewhere during this time for further recommendations  #50  Pt will be OOT 10/26-31, 11/3-11/11 Available for appt next week or 11/1 as only possible dates for allergist (or late pm 10/31)

## 2018-06-11 NOTE — Patient Instructions (Signed)
  Prednisone 10mg . Take 3 pills/day for the next 5 days, then 2 pills for a week, then 1 pill for a week. Contact us if flares in the process of this taper. Continue twice daily zyrtec and zantac 150mg  (vs changing to 20mg  Pepcid twice daily if/when you run out and if zantac isn't available). You may use benadryl for breakthrough hives, if needed.  You will hopefully be seeing the allergist somewhere during this time for further recommendations We put in referral

## 2018-06-15 DIAGNOSIS — F4322 Adjustment disorder with anxiety: Secondary | ICD-10-CM | POA: Diagnosis not present

## 2018-06-17 ENCOUNTER — Encounter: Payer: Self-pay | Admitting: Family Medicine

## 2018-06-23 ENCOUNTER — Encounter: Payer: Self-pay | Admitting: Family Medicine

## 2018-07-05 ENCOUNTER — Encounter: Payer: Self-pay | Admitting: Family Medicine

## 2018-07-08 DIAGNOSIS — F4322 Adjustment disorder with anxiety: Secondary | ICD-10-CM | POA: Diagnosis not present

## 2018-07-16 DIAGNOSIS — L501 Idiopathic urticaria: Secondary | ICD-10-CM | POA: Diagnosis not present

## 2018-07-16 DIAGNOSIS — J3081 Allergic rhinitis due to animal (cat) (dog) hair and dander: Secondary | ICD-10-CM | POA: Diagnosis not present

## 2018-07-16 DIAGNOSIS — J3089 Other allergic rhinitis: Secondary | ICD-10-CM | POA: Diagnosis not present

## 2018-07-16 DIAGNOSIS — J301 Allergic rhinitis due to pollen: Secondary | ICD-10-CM | POA: Diagnosis not present

## 2018-07-26 DIAGNOSIS — J039 Acute tonsillitis, unspecified: Secondary | ICD-10-CM | POA: Diagnosis not present

## 2018-07-27 ENCOUNTER — Encounter: Payer: Self-pay | Admitting: Family Medicine

## 2018-07-27 ENCOUNTER — Ambulatory Visit: Payer: BLUE CROSS/BLUE SHIELD | Admitting: Family Medicine

## 2018-07-27 VITALS — BP 102/70 | HR 76 | Temp 99.1°F | Ht 65.0 in | Wt 148.0 lb

## 2018-07-27 DIAGNOSIS — J36 Peritonsillar abscess: Secondary | ICD-10-CM | POA: Diagnosis not present

## 2018-07-27 DIAGNOSIS — J020 Streptococcal pharyngitis: Secondary | ICD-10-CM | POA: Diagnosis not present

## 2018-07-27 MED ORDER — PENICILLIN G BENZATHINE 1200000 UNIT/2ML IM SUSP
1.2000 10*6.[IU] | Freq: Once | INTRAMUSCULAR | Status: AC
  Administered 2018-07-27: 1.2 10*6.[IU] via INTRAMUSCULAR

## 2018-07-27 MED ORDER — LIDOCAINE VISCOUS HCL 2 % MT SOLN: 15.0000 mL | OROMUCOSAL | 0 refills | Status: DC | PRN

## 2018-07-27 MED ORDER — METRONIDAZOLE 500 MG PO TABS: 500.0000 mg | ORAL_TABLET | Freq: Three times a day (TID) | ORAL | 0 refills | Status: DC

## 2018-07-27 NOTE — Patient Instructions (Signed)
  Stop taking the oral penicillin that was prescribed in the urgent care--you got injectable penicillin today that will cover the course. Start taking metronidazole three times daily--this should give better anaerobic coverage to treat an early abscess.  Do not drink ANY alcohol while taking this. If you have persistent/worsening pain, swelling and fever, we will need to get you in with an ENT right away.  Peritonsillar Abscess A peritonsillar abscess is a collection of yellowish-white fluid (pus) in the back of the throat behind the tonsils. It usually occurs when an infection of the throat or tonsils (tonsillitis) spreads into the tissues around the tonsils. What are the causes? The infection that leads to a peritonsillar abscess is usually caused by streptococcal bacteria. What are the signs or symptoms?  Sore throat, often with pain on just one side.  Swelling and tenderness of the glands (lymph nodes) in the neck.  Difficulty swallowing.  Difficulty opening your mouth.  Fever.  Chills.  Drooling because of difficulty swallowing saliva.  Headache.  Changes in your voice.  Bad breath. How is this diagnosed? Your health care provider will take your medical history and do a physical exam. Imaging tests may be done, such as an ultrasound or CT scan. A sample of pus may be removed from the abscess using a needle (needle aspiration) or by swabbing the back of your throat. This sample will be sent to a lab for testing. How is this treated? Treatment usually involves draining the pus from the abscess. This may be done through needle aspiration or by making an incision in the abscess. You will also likely need to take antibiotic medicine. Follow these instructions at home:  Rest as much as possible and get plenty of sleep.  Take medicines only as directed by your health care provider.  If you were prescribed an antibiotic medicine, finish it all even if you start to feel  better.  If your abscess was drained by your health care provider, gargle with a mixture of salt and warm water: ? Mix 1 tsp of salt in 8 oz of warm water. ? Gargle with this mixture four times per day or as needed for comfort. ? Do not swallow this mixture.  Drink plenty of fluids.  While your throat is sore, eat soft or liquid foods, such as frozen ice pops and ice cream.  Keep all follow-up visits as directed by your health care provider. This is important. Contact a health care provider if:  You have increased pain, swelling, redness, or drainage in your throat.  You develop a headache, a lack of energy (lethargy), or generalized feelings of illness.  You have a fever.  You feel dizzy.  You have difficulty swallowing or eating.  You show signs of becoming dehydrated, such as: ? Light-headedness when standing. ? Decreased urine output. ? A fast heart rate. ? Dry mouth. Get help right away if:  You have difficulty talking or breathing, or you find it easier to breathe when you lean forward.  You are coughing up blood or vomiting blood.  You have severe throat pain that is not helped by medicines.  You start to drool. This information is not intended to replace advice given to you by your health care provider. Make sure you discuss any questions you have with your health care provider. Document Released: 08/12/2005 Document Revised: 01/18/2016 Document Reviewed: 03/28/2014 Elsevier Interactive Patient Education  2018 [employer]

## 2018-07-27 NOTE — Progress Notes (Signed)
Chief Complaint  Patient presents with  . Strep    seen at [facility] yesterday, diagnosed with strep. Woke up this and throat is worse and fever is higher-103.6 this am.    Fever started 2 nights ago, throat didn't get sore until yesterday. She went to [facility] yesterday, had + strep test.  Treated with Pen VK 500mg  BID. She was seen at 4pm, and did get 2 doses in last night. She couldn't sleep last night due to the pain in her throat, it is hard to swallow.  She took ibuprofen this morning (with her fever) about an hour ago. Mainly hurts on the right side of her throat and neck.  She had a cold leading up to this, felt very achey 2 nights ago and yesterday. Denies any significant cough or persistent URI symptoms, just sore throat.  No known flu or strep exposures. She travels a lot.  Has h/o peritonsillar abscess as a child (didn't require drainage).  PMH, PSH, SH reviewed  Saw allergies--changed antihistamines to xyzal daily, allegra prn.  She wasn't aware that singulair rx was sent to pharmacy, hasn't started.  Outpatient Encounter Medications as of 07/27/2018  Medication Sig Note  . drospirenone-ethinyl estradiol (YAZ,GIANVI,LORYNA) 3-0.02 MG tablet Take 1 tablet by mouth daily.   Marland Kitchen ibuprofen (ADVIL,MOTRIN) 200 MG tablet Take 800 mg by mouth every 6 (six) hours as needed. 07/27/2018: Last dose 1hr ago  . levocetirizine (XYZAL) 5 MG tablet 5 mg.    . penicillin v potassium (VEETID) 500 MG tablet Take 500 mg by mouth 2 (two) times daily.    . [DISCONTINUED] cetirizine (ZYRTEC) 10 MG tablet Take 10 mg by mouth 2 (two) times daily.   . diphenhydrAMINE (BENADRYL) 25 mg capsule Take 25 mg by mouth every 6 (six) hours as needed. 06/11/2018: Uses prn breakthrough hives during the night (rare)  . metroNIDAZOLE (FLAGYL) 500 MG tablet Take 1 tablet (500 mg total) by mouth 3 (three) times daily.   . montelukast (SINGULAIR) 10 MG tablet Take by mouth.    . [DISCONTINUED] predniSONE (DELTASONE) 10 MG  tablet Take taper as directed by physician   . [DISCONTINUED] ranitidine (ZANTAC) 150 MG capsule Take 150 mg by mouth 2 (two) times daily.    No facility-administered encounter medications on file as of 07/27/2018.    (was not taking flagyl prior to today's visit)  No Known Allergies   ROS:  +fever, sore throat per HPI.  URI symptoms resolving (other than sore throat). No nausea, vomiting, diarrhea, bleeding, bruising, rash.  Has intermittent hives, none currently.     PHYSICAL EXAM:  BP 102/70   Pulse 76   Temp 99.1 F (37.3 C) (Tympanic)   Ht 5\' 5"  (1.651 m)   Wt 148 lb (67.1 kg)   LMP 07/23/2018 (Approximate)   BMI 24.63 kg/m   Mildly ill-appearing, tired female, accompanied by her mother. HEENT: PERRL, EOMI, conjunctiva and sclera are clear. Nasal mucosa is normal. Sinuses are nontender.  OP: Tonsils are enlarged with overlying exudate bilaterally.  Uvula is touching the right side.There does appear to be erythema and swelling just anterior to the tonsil on the right (asymmetric, not noted on the left). Neck: +anterior cervical lymphadenopathy bilaterally, more tender on the right than on the left. Heart: regular rate and rhythm, no murmur Lungs: clear bilaterally Skin: normal turgor, no rash, no urticaria Neuro: alert and oriented, cranial nerves intact, normal gait  ASSESSMENT/PLAN:  Strep pharyngitis - Plan: lidocaine (XYLOCAINE) 2 % solution,  penicillin g benzathine (BICILLIN LA) 1200000 UNIT/2ML injection 1.2 Million Units  Peritonsillar abscess - suspect early R peritonsillar abscess. Given Bicillin (since given lower dose for strep throat only of amoxil), and add Flagyl for anaerobic coverage. - Plan: metroNIDAZOLE (FLAGYL) 500 MG tablet, penicillin g benzathine (BICILLIN LA) 1200000 UNIT/2ML injection 1.2 Million Units  Understands the need to f/u with ENT within the next 24-48 hours if progressive swelling rather than improvement.    Stop taking the oral  penicillin that was prescribed in the urgent care--you got injectable penicillin today that will cover the course. Start taking metronidazole three times daily--this should give better anaerobic coverage to treat an early abscess.  Do not drink ANY alcohol while taking this. If you have persistent/worsening pain, swelling and fever, we will need to get you in with an ENT right away.

## 2018-07-31 DIAGNOSIS — F4322 Adjustment disorder with anxiety: Secondary | ICD-10-CM | POA: Diagnosis not present

## 2018-08-03 ENCOUNTER — Encounter: Payer: Self-pay | Admitting: *Deleted

## 2018-09-01 ENCOUNTER — Encounter: Payer: Self-pay | Admitting: Family Medicine

## 2018-09-02 DIAGNOSIS — J3081 Allergic rhinitis due to animal (cat) (dog) hair and dander: Secondary | ICD-10-CM | POA: Diagnosis not present

## 2018-09-02 DIAGNOSIS — J3089 Other allergic rhinitis: Secondary | ICD-10-CM | POA: Diagnosis not present

## 2018-09-02 DIAGNOSIS — J301 Allergic rhinitis due to pollen: Secondary | ICD-10-CM | POA: Diagnosis not present

## 2018-09-02 DIAGNOSIS — L501 Idiopathic urticaria: Secondary | ICD-10-CM | POA: Diagnosis not present

## 2018-09-04 DIAGNOSIS — L508 Other urticaria: Secondary | ICD-10-CM | POA: Diagnosis not present

## 2018-09-04 DIAGNOSIS — L501 Idiopathic urticaria: Secondary | ICD-10-CM | POA: Diagnosis not present

## 2018-09-21 DIAGNOSIS — L508 Other urticaria: Secondary | ICD-10-CM | POA: Diagnosis not present

## 2018-09-23 DIAGNOSIS — L501 Idiopathic urticaria: Secondary | ICD-10-CM | POA: Diagnosis not present

## 2018-09-24 ENCOUNTER — Encounter: Payer: BLUE CROSS/BLUE SHIELD | Admitting: Family Medicine

## 2018-09-30 DIAGNOSIS — L501 Idiopathic urticaria: Secondary | ICD-10-CM | POA: Diagnosis not present

## 2018-10-21 DIAGNOSIS — L501 Idiopathic urticaria: Secondary | ICD-10-CM | POA: Diagnosis not present

## 2018-10-30 DIAGNOSIS — L501 Idiopathic urticaria: Secondary | ICD-10-CM | POA: Diagnosis not present

## 2018-11-13 DIAGNOSIS — L509 Urticaria, unspecified: Secondary | ICD-10-CM | POA: Diagnosis not present

## 2018-11-17 DIAGNOSIS — L501 Idiopathic urticaria: Secondary | ICD-10-CM | POA: Diagnosis not present

## 2018-12-17 DIAGNOSIS — Z5181 Encounter for therapeutic drug level monitoring: Secondary | ICD-10-CM | POA: Diagnosis not present

## 2018-12-17 DIAGNOSIS — L508 Other urticaria: Secondary | ICD-10-CM | POA: Diagnosis not present

## 2019-02-22 DIAGNOSIS — Z01419 Encounter for gynecological examination (general) (routine) without abnormal findings: Secondary | ICD-10-CM | POA: Diagnosis not present

## 2019-02-22 DIAGNOSIS — Z6823 Body mass index (BMI) 23.0-23.9, adult: Secondary | ICD-10-CM | POA: Diagnosis not present

## 2019-02-22 DIAGNOSIS — Z124 Encounter for screening for malignant neoplasm of cervix: Secondary | ICD-10-CM | POA: Diagnosis not present

## 2019-02-22 DIAGNOSIS — Z1151 Encounter for screening for human papillomavirus (HPV): Secondary | ICD-10-CM | POA: Diagnosis not present

## 2019-03-17 DIAGNOSIS — L501 Idiopathic urticaria: Secondary | ICD-10-CM | POA: Insufficient documentation

## 2019-03-17 DIAGNOSIS — J309 Allergic rhinitis, unspecified: Secondary | ICD-10-CM | POA: Insufficient documentation

## 2019-03-17 NOTE — Progress Notes (Signed)
Chief Complaint  Patient presents with  . Annual Exam    Sophia Ball is a [age] y.o. female who presents for a complete physical.  She sees Dr. Cherly Hensenousins for her GYN care, last visit 02/22/2019. She thinks she had a pap smear, but hasn't gotten any results.  Idiopathic urticaria:  She is under the care of Dr. Barnetta ChapelWhelan, and was started in Xolair injections in 08/2018. She had 2 shots, no improvement. After the 3rd injection her eyes and lips were very swollen. She saw [facility] Register at Dr. Scharlene GlossHall's office, who then [facility]. She was seen in Jan, March and April.  She was started on Dapsone (100mg  in the morning, 25mg  at bedtime).  She has been decreasing her antihistamines, no longer taking xyzal, just zyrtec BID, and is still doing well.  She has a letter for lab orders that [facility] wouldn't accept as an order.  (doesn't have with her today)  Allergic rhinitis: Not having problems. Taking montelukast and zyrtec daily.   Immunization History  Administered Date(s) Administered  . HPV Quadrivalent 08/22/2005, 10/24/2005, 02/13/2006  . Influenza-Unspecified 06/10/2014, 06/03/2018  . Tdap 06/11/2013   Last Pap smear: 10/2017 normal, Dr. Cherly Hensenousins; had pap 01/2019 Last mammogram: never Last colonoscopy: never Last DEXA: never Dentist: twice yearly Ophtho: none (denies problems) Exercise: 3x/week onlline workouts (streaming), which includes kettlebells.  Vitamin D-OH level was 25 on screening in 08/2014 Lipid: Lab Results  Component Value Date   CHOL 200 08/31/2014   HDL 110 08/31/2014   LDLCALC 76 08/31/2014   TRIG 68 08/31/2014   CHOLHDL 1.8 08/31/2014   Past Medical History:  Diagnosis Date  . Allergy   . Idiopathic urticaria   . LGSIL on Pap smear of cervix 2017   history of in 2017 with neg HPV    History reviewed. No pertinent surgical history.  Social History   Socioeconomic History  . Marital status: Significant Other    Spouse name: Not on file  . Number of  children: Not on file  . Years of education: Not on file  . Highest education level: Not on file  Occupational History  . Not on file  Social Needs  . Financial resource strain: Not on file  . Food insecurity    Worry: Not on file    Inability: Not on file  . Transportation needs    Medical: Not on file    Non-medical: Not on file  Tobacco Use  . Smoking status: Never Smoker  . Smokeless tobacco: Never Used  Substance and Sexual Activity  . Alcohol use: Yes    Alcohol/week: 3.0 - 5.0 standard drinks    Types: 3 - 5 Glasses of wine per week  . Drug use: No  . Sexual activity: Yes    Partners: Male    Birth control/protection: Pill  Lifestyle  . Physical activity    Days per week: Not on file    Minutes per session: Not on file  . Stress: Not on file  Relationships  . Social Musicianconnections    Talks on phone: Not on file    Gets together: Not on file    Attends religious service: Not on file    Active member of club or organization: Not on file    Attends meetings of clubs or organizations: Not on file    Relationship status: Not on file  . Intimate partner violence    Fear of current or ex partner: Not on file  Emotionally abused: Not on file    Physically abused: Not on file    Forced sexual activity: Not on file  Other Topics Concern  . 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 Has 1 dog (Great Dane)   Fiance has 3 and 626 yo daughters (summer 2020 will be staying with them full time)    Family History  Problem Relation Age of Onset  . Cancer Maternal Aunt 60       great aunt-breast  . Heart disease Maternal Grandmother   . Hyperlipidemia Maternal Grandmother   . Heart disease Maternal Grandfather   . Hyperlipidemia Maternal Grandfather   . Heart disease Paternal Grandmother   . Hyperlipidemia Paternal Grandmother   . Heart disease Paternal  Grandfather   . Hyperlipidemia Paternal Grandfather     Outpatient Encounter Medications as of 03/18/2019  Medication Sig Note  . cetirizine (ZYRTEC) 10 MG tablet Take 10 mg by mouth 2 (two) times daily.   . cimetidine (TAGAMET) 400 MG tablet 400 mg 2 (two) times daily.    . dapsone 100 MG tablet Take 100 mg by mouth daily.  03/18/2019: Takes in the morning  . dapsone 25 MG tablet Take 25 mg by mouth at bedtime.    . drospirenone-ethinyl estradiol (YAZ,GIANVI,LORYNA) 3-0.02 MG tablet Take 1 tablet by mouth daily.   . montelukast (SINGULAIR) 10 MG tablet Take by mouth.    . diphenhydrAMINE (BENADRYL) 25 mg capsule Take 25 mg by mouth every 6 (six) hours as needed. 06/11/2018: Uses prn breakthrough hives during the night (rare)  . EPINEPHrine 0.3 mg/0.3 mL IJ SOAJ injection  03/18/2019: prn  . fexofenadine (ALLEGRA) 180 MG tablet Take 180 mg by mouth daily.   . hydrOXYzine (VISTARIL) 25 MG capsule  03/18/2019: As needed  . ibuprofen (ADVIL,MOTRIN) 200 MG tablet Take 800 mg by mouth every 6 (six) hours as needed. 07/27/2018: Last dose 1hr ago  . levocetirizine (XYZAL) 5 MG tablet 5 mg.  03/18/2019: Take zyrtec   . [DISCONTINUED] lidocaine (XYLOCAINE) 2 % solution Use as directed 15 mLs in the mouth or throat every 4 (four) hours as needed for mouth pain. (Patient not taking: Reported on 03/18/2019)   . [DISCONTINUED] metroNIDAZOLE (FLAGYL) 500 MG tablet Take 1 tablet (500 mg total) by mouth 3 (three) times daily.   . [DISCONTINUED] penicillin v potassium (VEETID) 500 MG tablet Take 500 mg by mouth 2 (two) times daily.     No facility-administered encounter medications on file as of 03/18/2019.     No Known Allergies   ROS:  The patient denies anorexia, fever, weight changes (reports some gain during COVID and from prednisone), headaches,  vision changes, decreased hearing, ear pain, sore throat, breast concerns, chest pain, palpitations, dizziness, syncope, dyspnea on exertion, cough, swelling,  nausea, vomiting, diarrhea, constipation, abdominal pain, melena, hematochezia, indigestion/heartburn, hematuria, incontinence, dysuria, irregular menstrual cycles, vaginal discharge, odor or itch, genital lesions, joint pains, numbness, tingling, weakness, tremor, suspicious skin lesions, depression, anxiety, abnormal bleeding/bruising, or enlarged lymph nodes. Allergies and urticaria are well controlled with medications   PHYSICAL EXAM:  BP 110/74   Pulse 66   Temp 97.7 F (36.5 C) (Oral)   Ht 5' 6.75" (1.695 m)   Wt 146 lb 12.8 oz (66.6 kg)   SpO2 94%   BMI 23.16 kg/m   Wt Readings from Last 3 Encounters:  03/18/19 146  lb 12.8 oz (66.6 kg)  07/27/18 148 lb (67.1 kg)  06/11/18 147 lb 12.8 oz (67 kg)    General Appearance:    Alert, cooperative, no distress, appears stated age  Head:    Normocephalic, without obvious abnormality, atraumatic  Eyes:    PERRL, conjunctiva/corneas clear, EOM's intact, fundi    benign  Ears:    Normal TM's and external ear canals  Nose:   Not examined (wearing mask due to COVID-19)  Throat:   Not examined (wearing mask due to COVID-19)  Neck:   Supple, no lymphadenopathy;  thyroid:  no enlargement/ tenderness/nodules; no carotid bruit or JVD  Back:    Spine nontender, no curvature, ROM normal, no CVA     tenderness  Lungs:     Clear to auscultation bilaterally without wheezes, rales or     ronchi; respirations unlabored  Chest Wall:    No tenderness or deformity   Heart:    Regular rate and rhythm, S1 and S2 normal, no murmur, rub   or gallop  Breast Exam:    Deferred to GYN  Abdomen:     Soft, non-tender, nondistended, normoactive bowel sounds,    no masses, no hepatosplenomegaly  Genitalia:    Deferred to GYN     Extremities:   No clubbing, cyanosis or edema  Pulses:   2+ and symmetric all extremities  Skin:   Skin color, texture, turgor normal, no rashes or lesions  Lymph nodes:   Cervical, supraclavicular, and axillary nodes normal   Neurologic:   CNII-XII intact, normal strength, sensation and gait; reflexes 2+ and symmetric throughout          Psych:   Normal mood, affect, hygiene and grooming.    ASSESSMENT/PLAN:  Annual physical exam - Plan: VITAMIN D 25 Hydroxy (Vit-D Deficiency, Fractures), Lipid pane  Idiopathic urticaria - well controlled on current regimen from [facility] derm   Allergic rhinitis, unspecified seasonality, unspecified trigger - controlled   Vitamin D deficiency - low in winter (2016); recheck level now. If borderline on the low side, may need supplement in the winter - Plan: VITAMIN D 25 Hydroxy (Vit-D Deficiency, Fractures)   Pt will return for fasting labs. She will also bring in letter from [facility] and those labs can be added and drawn here as well.  Discussed that she can send message to [facility] derm to let them know it was done and they can view through [place].  F/u 1 year, sooner prn.

## 2019-03-17 NOTE — Patient Instructions (Addendum)
  HEALTH MAINTENANCE RECOMMENDATIONS:  It is recommended that you get at least 30 minutes of aerobic exercise at least 5 days/week (for weight loss, you may need as much as 60-90 minutes). This can be any activity that gets your heart rate up. This can be divided in 10-15 minute intervals if needed, but try and build up your endurance at least once a week.  Weight bearing exercise is also recommended twice weekly.  Eat a healthy diet with lots of vegetables, fruits and fiber.  "Colorful" foods have a lot of vitamins (ie green vegetables, tomatoes, red peppers, etc).  Limit sweet tea, regular sodas and alcoholic beverages, all of which has a lot of calories and sugar.  Up to 1 alcoholic drink daily may be beneficial for women (unless trying to lose weight, watch sugars).  Drink a lot of water.  Calcium recommendations are 1200-1500 mg daily (1500 mg for postmenopausal women or women without ovaries), and vitamin D 1000 IU daily.  This should be obtained from diet and/or supplements (vitamins), and calcium should not be taken all at once, but in divided doses.  Monthly self breast exams and yearly mammograms for women over the age of 11 is recommended.  Sunscreen of at least SPF 30 should be used on all sun-exposed parts of the skin when outside between the hours of 10 am and 4 pm (not just when at beach or pool, but even with exercise, golf, tennis, and yard work!)  Use a sunscreen that says "broad spectrum" so it covers both UVA and UVB rays, and make sure to reapply every 1-2 hours.  Remember to change the batteries in your smoke detectors when changing your clock times in the spring and fall. Carbon monoxide detectors are recommended for your home.  Use your seat belt every time you are in a car, and please drive safely and not be distracted with cell phones and texting while driving.  Return for fasting labs.  If your vitamin D level is on the low-normal side now, then you should take a supplement  in the winter months, when you are getting less sun (1000 IU daily).

## 2019-03-18 ENCOUNTER — Other Ambulatory Visit: Payer: Self-pay

## 2019-03-18 ENCOUNTER — Encounter: Payer: Self-pay | Admitting: Family Medicine

## 2019-03-18 ENCOUNTER — Ambulatory Visit: Payer: BC Managed Care – PPO | Admitting: Family Medicine

## 2019-03-18 VITALS — BP 110/74 | HR 66 | Temp 97.7°F | Ht 66.75 in | Wt 146.8 lb

## 2019-03-18 DIAGNOSIS — L501 Idiopathic urticaria: Secondary | ICD-10-CM

## 2019-03-18 DIAGNOSIS — J309 Allergic rhinitis, unspecified: Secondary | ICD-10-CM | POA: Diagnosis not present

## 2019-03-18 DIAGNOSIS — E559 Vitamin D deficiency, unspecified: Secondary | ICD-10-CM

## 2019-03-18 DIAGNOSIS — Z0000 Encounter for general adult medical examination without abnormal findings: Secondary | ICD-10-CM | POA: Diagnosis not present

## 2019-03-18 DIAGNOSIS — Z5181 Encounter for therapeutic drug level monitoring: Secondary | ICD-10-CM

## 2019-03-18 NOTE — Telephone Encounter (Signed)
Ignore the [facility] chart about her bringing in a sheet with orders.  She sent them and I already entered the orders.  So, her lab visit should be for c-met, CBC, lipids and Vit D, all orders entered.

## 2019-03-19 ENCOUNTER — Other Ambulatory Visit: Payer: BC Managed Care – PPO

## 2019-03-19 DIAGNOSIS — Z0000 Encounter for general adult medical examination without abnormal findings: Secondary | ICD-10-CM | POA: Diagnosis not present

## 2019-03-19 DIAGNOSIS — L501 Idiopathic urticaria: Secondary | ICD-10-CM

## 2019-03-19 DIAGNOSIS — Z5181 Encounter for therapeutic drug level monitoring: Secondary | ICD-10-CM

## 2019-03-19 DIAGNOSIS — E559 Vitamin D deficiency, unspecified: Secondary | ICD-10-CM | POA: Diagnosis not present

## 2019-03-20 LAB — CBC WITH DIFFERENTIAL/PLATELET
Basophils Absolute: 0 10*3/uL (ref 0.0–0.2)
Basos: 0 %
EOS (ABSOLUTE): 0.1 10*3/uL (ref 0.0–0.4)
Eos: 2 %
Hematocrit: 36.8 % (ref 34.0–46.6)
Hemoglobin: 11.8 g/dL (ref 11.1–15.9)
Immature Grans (Abs): 0 10*3/uL (ref 0.0–0.1)
Immature Granulocytes: 0 %
Lymphocytes Absolute: 1.5 10*3/uL (ref 0.7–3.1)
Lymphs: 24 %
MCH: 30.3 pg (ref 26.6–33.0)
MCHC: 32.1 g/dL (ref 31.5–35.7)
MCV: 94 fL (ref 79–97)
Monocytes Absolute: 0.6 10*3/uL (ref 0.1–0.9)
Monocytes: 10 %
Neutrophils Absolute: 3.9 10*3/uL (ref 1.4–7.0)
Neutrophils: 64 %
Platelets: 269 10*3/uL (ref 150–450)
RBC: 3.9 x10E6/uL (ref 3.77–5.28)
RDW: 11.7 % (ref 11.7–15.4)
WBC: 6.1 10*3/uL (ref 3.4–10.8)

## 2019-03-20 LAB — COMPREHENSIVE METABOLIC PANEL
ALT: 8 IU/L (ref 0–32)
AST: 13 IU/L (ref 0–40)
Albumin/Globulin Ratio: 2 (ref 1.2–2.2)
Albumin: 4.3 g/dL (ref 3.9–5.0)
Alkaline Phosphatase: 46 IU/L (ref 39–117)
BUN/Creatinine Ratio: 9 (ref 9–23)
BUN: 8 mg/dL (ref 6–20)
Bilirubin Total: 0.3 mg/dL (ref 0.0–1.2)
CO2: 23 mmol/L (ref 20–29)
Calcium: 8.6 mg/dL — ABNORMAL LOW (ref 8.7–10.2)
Chloride: 102 mmol/L (ref 96–106)
Creatinine, Ser: 0.9 mg/dL (ref 0.57–1.00)
GFR calc Af Amer: 99 mL/min/{1.73_m2} (ref >59)
GFR calc non Af Amer: 86 mL/min/{1.73_m2} (ref >59)
Globulin, Total: 2.2 g/dL (ref 1.5–4.5)
Glucose: 85 mg/dL (ref 65–99)
Potassium: 4.4 mmol/L (ref 3.5–5.2)
Sodium: 139 mmol/L (ref 134–144)
Total Protein: 6.5 g/dL (ref 6.0–8.5)

## 2019-03-20 LAB — LIPID PANEL
Chol/HDL Ratio: 1.8 ratio (ref 0.0–4.4)
Cholesterol, Total: 158 mg/dL (ref 100–199)
HDL: 89 mg/dL (ref >39)
LDL Calculated: 52 mg/dL (ref 0–99)
Triglycerides: 86 mg/dL (ref 0–149)
VLDL Cholesterol Cal: 17 mg/dL (ref 5–40)

## 2019-03-20 LAB — VITAMIN D 25 HYDROXY (VIT D DEFICIENCY, FRACTURES): Vit D, 25-Hydroxy: 31.7 ng/mL (ref 30.0–100.0)

## 2019-03-22 ENCOUNTER — Encounter: Payer: Self-pay | Admitting: Family Medicine

## 2019-05-14 DIAGNOSIS — R87810 Cervical high risk human papillomavirus (HPV) DNA test positive: Secondary | ICD-10-CM | POA: Diagnosis not present

## 2019-05-14 DIAGNOSIS — R87610 Atypical squamous cells of undetermined significance on cytologic smear of cervix (ASC-US): Secondary | ICD-10-CM | POA: Diagnosis not present

## 2019-06-02 DIAGNOSIS — Z20828 Contact with and (suspected) exposure to other viral communicable diseases: Secondary | ICD-10-CM | POA: Diagnosis not present

## 2019-07-28 DIAGNOSIS — Z20828 Contact with and (suspected) exposure to other viral communicable diseases: Secondary | ICD-10-CM | POA: Diagnosis not present

## 2019-10-21 DIAGNOSIS — Z713 Dietary counseling and surveillance: Secondary | ICD-10-CM | POA: Diagnosis not present

## 2020-04-05 DIAGNOSIS — Z124 Encounter for screening for malignant neoplasm of cervix: Secondary | ICD-10-CM | POA: Diagnosis not present

## 2020-04-05 DIAGNOSIS — D2372 Other benign neoplasm of skin of left lower limb, including hip: Secondary | ICD-10-CM | POA: Diagnosis not present

## 2020-04-05 DIAGNOSIS — Z01419 Encounter for gynecological examination (general) (routine) without abnormal findings: Secondary | ICD-10-CM | POA: Diagnosis not present

## 2020-04-05 DIAGNOSIS — Z113 Encounter for screening for infections with a predominantly sexual mode of transmission: Secondary | ICD-10-CM | POA: Diagnosis not present

## 2020-04-05 DIAGNOSIS — L508 Other urticaria: Secondary | ICD-10-CM | POA: Diagnosis not present

## 2022-05-01 ENCOUNTER — Encounter: Payer: Self-pay | Admitting: Internal Medicine
